# Patient Record
Sex: Female | Born: 1990 | Race: Black or African American | Marital: Single | Smoking: Never smoker
Health system: Southern US, Community
[De-identification: ages and names within clinical notes are randomized; demographics above are authoritative.]

## PROBLEM LIST (undated history)

## (undated) ENCOUNTER — Ambulatory Visit: Admission: EM | Discharge status: Absent without leave | Payer: Self-pay

---

## 2011-12-19 ENCOUNTER — Other Ambulatory Visit: Payer: Self-pay | Admitting: Specialist

## 2011-12-19 ENCOUNTER — Ambulatory Visit
Admission: RE | Admit: 2011-12-19 | Discharge: 2011-12-19 | Disposition: A | Discharge status: Home / Self Care | Payer: No Typology Code available for payment source | Source: Ambulatory Visit | Attending: Specialist | Admitting: Specialist

## 2011-12-19 DIAGNOSIS — R7611 Nonspecific reaction to tuberculin skin test without active tuberculosis: Secondary | ICD-10-CM

## 2014-04-07 IMAGING — CR DG CHEST 1V
1 series · 1 of 1 positions shown · non-contrast
Comparison: None.

CLINICAL DATA: Positive blood test for TB

CHEST - 1 VIEW

[w chest pa]
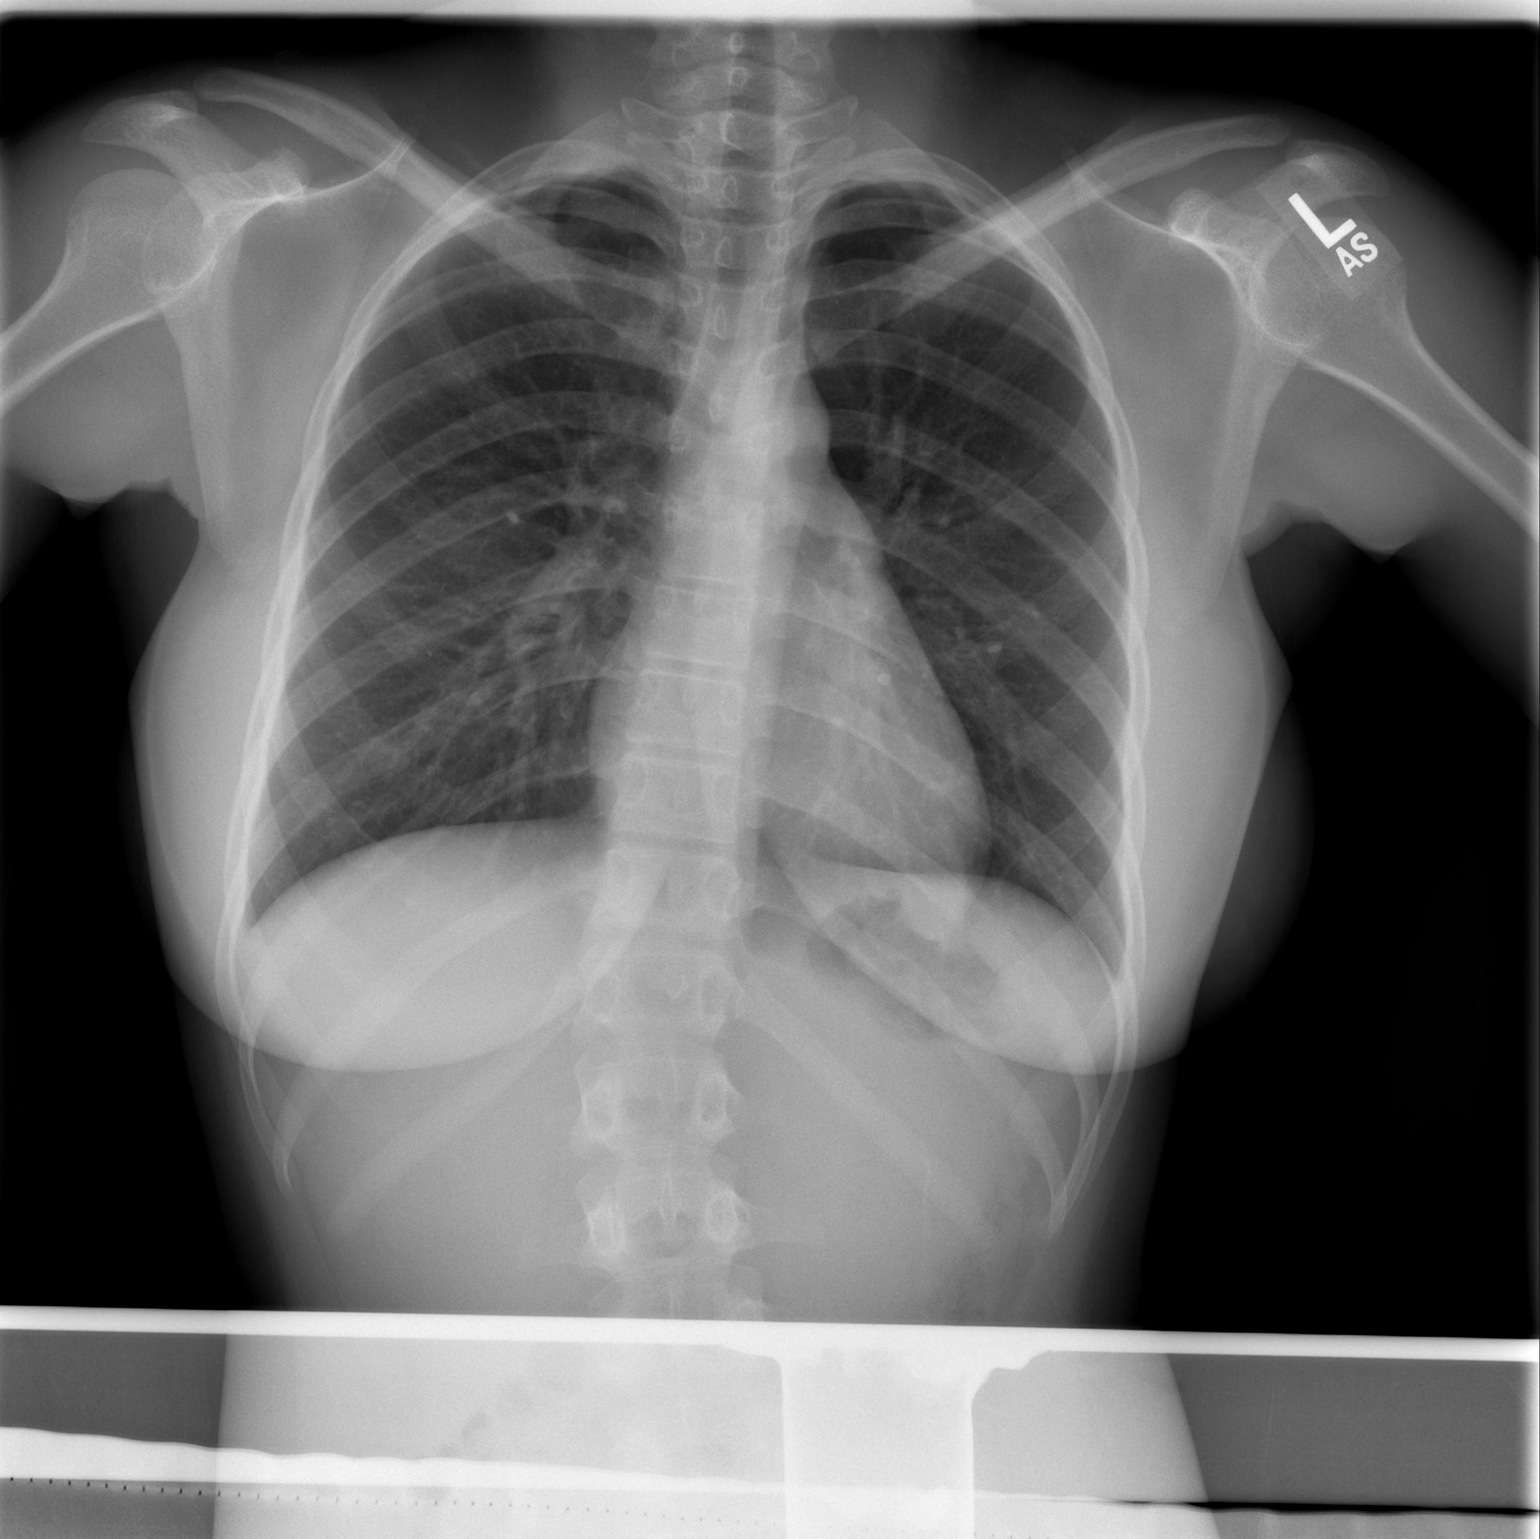

[1 of 1 positions shown; findings below may reference images not displayed]

FINDINGS: Heart and mediastinal contours are within normal limits.
The lung fields appear clear with no signs of focal infiltrate or
congestive failure.  No pleural fluid or significant peribronchial
cuffing is seen.

Bony structures appear intact.
IMPRESSION: No worrisome focal or acute cardiopulmonary abnormality seen.
Specifically no radiographic evidence for tuberculosis is seen

## 2015-05-29 ENCOUNTER — Inpatient Hospital Stay (HOSPITAL_COMMUNITY)
Admission: AD | Admit: 2015-05-29 | Discharge: 2015-05-29 | Discharge status: Absent without leave | Payer: Self-pay | Attending: Family Medicine | Admitting: Family Medicine

## 2015-06-28 ENCOUNTER — Ambulatory Visit (INDEPENDENT_AMBULATORY_CARE_PROVIDER_SITE_OTHER): Payer: Self-pay

## 2015-06-28 DIAGNOSIS — Z3201 Encounter for pregnancy test, result positive: Secondary | ICD-10-CM

## 2015-06-28 LAB — POCT PREGNANCY, URINE: Preg Test, Ur: POSITIVE — AB

## 2015-06-28 NOTE — Progress Notes (Signed)
Pt here today for pregnancy test.  Resulted positive.  Pt states that her LMP 05/23/15.  EDD 02/27/16.  Pt informed me that she does not feel that she wants to keep the baby.  I explained to pt that it is her choice.  I advised pt that she can contact [facility] and they can help her with her decision.  [facility] info and proof of pregnancy given to pt.   Pt agreed to recommendations.

## 2016-12-03 ENCOUNTER — Ambulatory Visit (INDEPENDENT_AMBULATORY_CARE_PROVIDER_SITE_OTHER): Payer: Self-pay

## 2016-12-03 DIAGNOSIS — Z3202 Encounter for pregnancy test, result negative: Secondary | ICD-10-CM

## 2016-12-03 LAB — POCT PREGNANCY, URINE: PREG TEST UR: NEGATIVE

## 2016-12-03 NOTE — Progress Notes (Signed)
Patient presented to the office for a UPT, UPT negative. Patient stated that she had taken a home pregnancy test in the evening and it was positive. Patient states that she is on the Depo and has not missed a dose. Patient stated that she is not having pregnancy symptoms. Patient stated that her last period was lighter and I explained to her that is expected with Depo. Patient had no questions and verbalized understanding.

## 2017-03-06 ENCOUNTER — Emergency Department (HOSPITAL_COMMUNITY)
Admission: EM | Admit: 2017-03-06 | Discharge: 2017-03-06 | Disposition: A | Discharge status: Home / Self Care | Payer: Self-pay | Attending: Emergency Medicine | Admitting: Emergency Medicine

## 2017-03-06 ENCOUNTER — Encounter (HOSPITAL_COMMUNITY): Payer: Self-pay

## 2017-03-06 DIAGNOSIS — K0889 Other specified disorders of teeth and supporting structures: Secondary | ICD-10-CM | POA: Insufficient documentation

## 2017-03-06 MED ORDER — AMOXICILLIN 500 MG PO CAPS: 500.0000 mg | ORAL_CAPSULE | Freq: Three times a day (TID) | ORAL | 0 refills | Status: DC

## 2017-03-06 NOTE — ED Provider Notes (Signed)
[facility]   CSN: [id number] Arrival date & time: 03/06/17  1112  History   Chief Complaint Chief Complaint  Patient presents with  . Dental Pain    HPI Samantha Bartlett is a 26 y.o. female.  HPI   Patient to the ER for right lower molar pain that started 3 days ago. She has not taken any medication for it prior to arrival. She does not have a dentist. Her pain is 3-4/10. The tooth is broken. She has not had any drooling, fevers, nausea, vomiting, diarrhea, confusion, abdominal pain, neck pain or foul taste in her mouth.  History reviewed. No pertinent past medical history.  There are no active problems to display for this patient.   History reviewed. No pertinent surgical history.  OB History    No data available       Home Medications    Prior to Admission medications   Medication Sig Start Date End Date Taking? Authorizing Provider  amoxicillin (AMOXIL) 500 MG capsule Take 1 capsule (500 mg total) by mouth 3 (three) times daily. 03/06/17   Marlon PelGreene, Chistina Roston, PA-C    Family History No family history on file.  Social History Social History  Substance Use Topics  . Smoking status: Never Smoker  . Smokeless tobacco: Never Used  . Alcohol use Not on file     Allergies   Patient has no known allergies.   Review of Systems Review of Systems Negative ROS aside from pertinent positives and negatives as listed in HPI   Physical Exam Updated Vital Signs BP 112/81   Pulse 83   Temp 98.6 F (37 C) (Oral)   Resp 18   SpO2 100%   Physical Exam  Constitutional: She appears well-developed and well-nourished. No distress.  HENT:  Head: Normocephalic and atraumatic.  Mouth/Throat: Uvula is midline, oropharynx is clear and moist and mucous membranes are normal. Normal dentition. Dental caries (Pts tooth shows no obvious abscess but moderate to severe tenderness to palpation of marked tooth) present.  No uvula swelling.    No s/sx of ludwigs angina.  Eyes: Pupils are equal, round, and reactive to light.  Neck: Trachea normal, normal range of motion and full passive range of motion without pain. Neck supple.  Cardiovascular: Normal rate, regular rhythm, normal heart sounds and normal pulses.   Pulmonary/Chest: Effort normal and breath sounds normal. No respiratory distress. Chest wall is not dull to percussion. She exhibits no tenderness, no crepitus, no edema, no deformity and no retraction.  Abdominal: Normal appearance.  Musculoskeletal: Normal range of motion.  Neurological: She is alert. She has normal strength.  Skin: Skin is warm, dry and intact. She is not diaphoretic.  Psychiatric: She has a normal mood and affect. Her speech is normal. Cognition and memory are normal.     ED Treatments / Results  Labs (all labs ordered are listed, but only abnormal results are displayed) Labs Reviewed - No data to display  EKG  EKG Interpretation None       Radiology No results found.  Procedures Procedures (including critical care time)  Medications Ordered in ED Medications - No data to display   Initial Impression / Assessment and Plan / ED Course  I have reviewed the triage vital signs and the nursing notes.  Pertinent labs & imaging results that were available during my care of the patient were reviewed by me and considered in my medical decision making (see chart for details).  Dental pain associated with dental infection and possible dental abscess with patient afebrile, non toxic appearing and swallowing secretions well. I gave patient referral to dentist and stressed the importance of dental follow up for ultimate management of dental pain. I have also discussed reasons to return immediately to the ER.  Patient expresses understanding and agrees with plan.  I will also give doxycycline and pain control.    Final Clinical Impressions(s) / ED Diagnoses   Final  diagnoses:  Pain, dental    New Prescriptions New Prescriptions   AMOXICILLIN (AMOXIL) 500 MG CAPSULE    Take 1 capsule (500 mg total) by mouth 3 (three) times daily.     Marlon Pel, PA-C 03/06/17 1439    Cathren Laine, MD 03/07/17 [phone]

## 2017-03-06 NOTE — ED Triage Notes (Signed)
Patient complains of lower right gum/jaw pain x 3 weeks. Denies broken tooth, Nad

## 2021-03-16 ENCOUNTER — Other Ambulatory Visit: Payer: Self-pay

## 2021-03-16 ENCOUNTER — Ambulatory Visit
Admission: RE | Admit: 2021-03-16 | Discharge: 2021-03-16 | Disposition: A | Discharge status: Home / Self Care | Payer: BLUE CROSS/BLUE SHIELD | Source: Ambulatory Visit

## 2021-03-16 NOTE — ED Provider Notes (Signed)
Patient presents with a pustule on her the lateral aspect of her right lower leg, states this is the third similar pustule that she has had in the past 5 months.  Patient states that the pustules started out very tiny and are itchy initially then they swell to a large blistered filled with thick purulent fluid.  Patient states eventually she will deroofed them and they drain, finally they heal on their own, she shows me the last 2 sites where these occurred, there is very little scar tissue that remains.  Patient denies recent systemic illness, upper respiratory infection, known sick contacts.  Patient presents to the [facility] today to have them evaluated states that since this is the third 1 she really like to know what is going on.  Per my observation, the pustule is approximately 1.5 cm in diameter, does not appear to be deep to, surrounded by warm, erythematous skin at this time, pustule is intact.  On arrival today, patient has a temperature of 99 but she does not appear toxic, denies chills.  Do not appreciate any streaking up her leg today.  Clinical administrator was contacted regarding performing an aerobic/anaerobic culture of the fluid within this lesion, I was advised that we do not have the proper transport medium for this kind of test here at [facility].  Was also advised that the urgent care at [facility] is because it is connected the hospital.  I provided this information to the patient, I offered the choice between opening the wound and draining it now so that he can heal versus opening the wound to drain it so that the fluid can be sent for culture and possible definitive diagnosis.  She decided that she would make an appointment with [facility] urgent care for tomorrow to have the lesion deroofed and or drained that the fluid within can be sent for culture.     Theadora Rama Scales, PA-C 03/16/21 1407

## 2021-03-16 NOTE — ED Triage Notes (Signed)
Pt has a pustule on RLE. Patient states that it is painful. Started: A week ago

## 2021-03-17 ENCOUNTER — Encounter (HOSPITAL_COMMUNITY): Payer: Self-pay

## 2021-03-17 ENCOUNTER — Ambulatory Visit (HOSPITAL_COMMUNITY)
Admission: RE | Admit: 2021-03-17 | Discharge: 2021-03-17 | Disposition: A | Discharge status: Home / Self Care | Payer: BLUE CROSS/BLUE SHIELD | Source: Ambulatory Visit | Attending: Physician Assistant | Admitting: Physician Assistant

## 2021-03-17 ENCOUNTER — Other Ambulatory Visit: Payer: Self-pay

## 2021-03-17 VITALS — BP 105/75 | HR 62 | Temp 98.5°F | Resp 18

## 2021-03-17 DIAGNOSIS — L02415 Cutaneous abscess of right lower limb: Secondary | ICD-10-CM | POA: Diagnosis not present

## 2021-03-17 DIAGNOSIS — L0291 Cutaneous abscess, unspecified: Secondary | ICD-10-CM | POA: Diagnosis present

## 2021-03-17 DIAGNOSIS — L03115 Cellulitis of right lower limb: Secondary | ICD-10-CM | POA: Insufficient documentation

## 2021-03-17 MED ORDER — SULFAMETHOXAZOLE-TRIMETHOPRIM 800-160 MG PO TABS: 1.0000 | ORAL_TABLET | Freq: Two times a day (BID) | ORAL | 0 refills | Status: AC

## 2021-03-17 MED ORDER — MUPIROCIN 2 % EX OINT: 1.0000 "application " | TOPICAL_OINTMENT | Freq: Every day | CUTANEOUS | 0 refills | Status: AC

## 2021-03-17 MED ORDER — CEPHALEXIN 500 MG PO CAPS: 500.0000 mg | ORAL_CAPSULE | Freq: Three times a day (TID) | ORAL | 0 refills | Status: AC

## 2021-03-17 NOTE — ED Triage Notes (Signed)
Abscess to lateral right lower leg.  Lower leg is red, warm to touch, painful.  Patient noticed this area on Sunday 03/11/2021.  This is the 3 rd episode of having an abscess.  Once on thigh, second time on left leg.

## 2021-03-17 NOTE — ED Notes (Signed)
Specimen transported to lab from treatment room.

## 2021-03-17 NOTE — ED Provider Notes (Signed)
[facility]    CSN: [id number] Arrival date & time: 03/17/21  1342      History   Chief Complaint Chief Complaint  Patient presents with   Appointment    2:00   Abscess    HPI Tierany Mitzie Marlar is a 30 y.o. female.   Patient presents today for evaluation of abscess on her right lateral lower leg.  She was seen by one of our other clinics yesterday at which point they were concerned that they could not send off culture is recommended she follow-up with our clinic today.  She reports a history of recurrent abscesses over the past several months always on her legs.  Denies any insect bite.  Denies any exposure to plants.  Denies any recent antibiotic use.  She has tried cleaning affected area and applying Neosporin but has not tried other medicines.  She denies history of diabetes or other immunosuppression.  She does report associated pain which is rated 5 on a 0-10 pain scale, localized to affected area, described as aching, worse with palpation, no alleviating factors identified.  She denies any fever, nausea, vomiting, body aches.   History reviewed. No pertinent past medical history.  There are no problems to display for this patient.   History reviewed. No pertinent surgical history.  OB History   No obstetric history on file.      Home Medications    Prior to Admission medications   Medication Sig Start Date End Date Taking? Authorizing Provider  cephALEXin (KEFLEX) 500 MG capsule Take 1 capsule (500 mg total) by mouth 3 (three) times daily. 03/17/21  Yes Hanifah Royse, Denny Peon K, PA-C  mupirocin ointment (BACTROBAN) 2 % Apply 1 application topically daily. 03/17/21  Yes Cassiopeia Florentino K, PA-C  sulfamethoxazole-trimethoprim (BACTRIM DS) 800-160 MG tablet Take 1 tablet by mouth 2 (two) times daily for 7 days. 03/17/21 03/24/21 Yes Katelyn Kohlmeyer, Noberto Retort, PA-C    Family History Family History  Problem Relation Age of Onset   Healthy Mother    Healthy Father     Social  History Social History   Tobacco Use   Smoking status: Never   Smokeless tobacco: Never  Vaping Use   Vaping Use: Never used  Substance Use Topics   Alcohol use: Yes    Comment: rare   Drug use: Never     Allergies   Patient has no known allergies.   Review of Systems Review of Systems  Constitutional:  Positive for activity change. Negative for appetite change, fatigue and fever.  Respiratory:  Negative for cough and shortness of breath.   Cardiovascular:  Negative for chest pain.  Gastrointestinal:  Negative for abdominal pain, diarrhea, nausea and vomiting.  Musculoskeletal:  Negative for arthralgias and myalgias.  Skin:  Positive for color change and wound.  Neurological:  Negative for dizziness, light-headedness and headaches.    Physical Exam Triage Vital Signs ED Triage Vitals  Enc Vitals Group     BP 03/17/21 1423 105/75     Pulse Rate 03/17/21 1423 62     Resp 03/17/21 1423 18     Temp 03/17/21 1423 98.5 F (36.9 C)     Temp Source 03/17/21 1423 Oral     SpO2 03/17/21 1423 100 %     Weight --      Height --      Head Circumference --      Peak Flow --      Pain Score 03/17/21 1420 5  Pain Loc --      Pain Edu? --      Excl. in GC? --    No data found.  Updated Vital Signs BP 105/75 (BP Location: Right Arm)   Pulse 62   Temp 98.5 F (36.9 C) (Oral)   Resp 18   LMP 02/19/2021   SpO2 100%   Visual Acuity Right Eye Distance:   Left Eye Distance:   Bilateral Distance:    Right Eye Near:   Left Eye Near:    Bilateral Near:     Physical Exam Vitals reviewed.  Constitutional:      General: She is awake. She is not in acute distress.    Appearance: Normal appearance. She is well-developed. She is not ill-appearing.     Comments: Very pleasant female appears stated age in no acute distress sitting comfortably in exam room  HENT:     Head: Normocephalic and atraumatic.  Cardiovascular:     Rate and Rhythm: Normal rate and regular rhythm.      Heart sounds: Normal heart sounds, S1 normal and S2 normal. No murmur heard. Pulmonary:     Effort: Pulmonary effort is normal.     Breath sounds: Normal breath sounds. No wheezing, rhonchi or rales.  Abdominal:     Palpations: Abdomen is soft.     Tenderness: There is no abdominal tenderness.  Skin:    Findings: Abscess and rash present. Rash is pustular.     Comments: 2 cm x 1 cm abscess noted right lateral leg.  Surrounding erythema and edema.  No streaking or evidence of lymphangitis.  No active bleeding or drainage.  Psychiatric:        Behavior: Behavior is cooperative.     UC Treatments / Results  Labs (all labs ordered are listed, but only abnormal results are displayed) Labs Reviewed  AEROBIC/ANAEROBIC CULTURE W GRAM STAIN (SURGICAL/DEEP WOUND)    EKG   Radiology No results found.  Procedures Procedures (including critical care time)  Medications Ordered in UC Medications - No data to display  Initial Impression / Assessment and Plan / UC Course  I have reviewed the triage vital signs and the nursing notes.  Pertinent labs & imaging results that were available during my care of the patient were reviewed by me and considered in my medical decision making (see chart for details).     Wound was unroofed and culture collected.  Adequate drainage of lesion while in office.  Area was irrigated with saline and cleansed with chlorhexidine.  Area was dressed and wrapped.  Patient was prescribed Bactrim and Keflex.  Culture was sent we will tailor antibiotics based on sensitivities identified on culture.  She was instructed to keep area clean and apply Bactroban with dressing changes.  Discussed alarm symptoms that would warrant emergent evaluation including spread of erythema/edema despite antibiotics, fever, nausea, vomiting, recurrent lesions.  Strict return precautions given.  Patient does not have a primary care provider so we will try to establish her with 1 via PCP  assistance.  Final Clinical Impressions(s) / UC Diagnoses   Final diagnoses:  Abscess  Cellulitis and abscess of right leg     Discharge Instructions      Please keep area clean by washing it with soap and water.  Apply Bactroban ointment daily with dressing changes.  Start Keflex and Bactrim.  We will contact you if your culture indicates we need to change your antibiotics.  Please monitor the area of redness and  pain.  If this continues to swell or get larger despite antibiotics you need to be reevaluated as we discussed.  We are going to try to find your primary care provider but if you have any concerns in the meantime please return to our clinic.     ED Prescriptions     Medication Sig Dispense Auth. Provider   cephALEXin (KEFLEX) 500 MG capsule Take 1 capsule (500 mg total) by mouth 3 (three) times daily. 21 capsule Wynetta Seith K, PA-C   mupirocin ointment (BACTROBAN) 2 % Apply 1 application topically daily. 22 g Rosielee Corporan K, PA-C   sulfamethoxazole-trimethoprim (BACTRIM DS) 800-160 MG tablet Take 1 tablet by mouth 2 (two) times daily for 7 days. 14 tablet Sky Borboa, Noberto Retort, PA-C      PDMP not reviewed this encounter.   Jeani Hawking, PA-C 03/17/21 1449

## 2021-03-17 NOTE — Discharge Instructions (Signed)
Please keep area clean by washing it with soap and water.  Apply Bactroban ointment daily with dressing changes.  Start Keflex and Bactrim.  We will contact you if your culture indicates we need to change your antibiotics.  Please monitor the area of redness and pain.  If this continues to swell or get larger despite antibiotics you need to be reevaluated as we discussed.  We are going to try to find your primary care provider but if you have any concerns in the meantime please return to our clinic.

## 2021-03-19 LAB — AEROBIC CULTURE W GRAM STAIN (SUPERFICIAL SPECIMEN): Gram Stain: NONE SEEN
